# Patient Record
Sex: Male | Born: 1985 | Race: White | Hispanic: No | Marital: Single | Smoking: Never smoker
Health system: Southern US, Academic
[De-identification: ages and names within clinical notes are randomized; demographics above are authoritative.]

## PROBLEM LIST (undated history)

## (undated) DIAGNOSIS — K219 Gastro-esophageal reflux disease without esophagitis: Secondary | ICD-10-CM

## (undated) DIAGNOSIS — T7840XA Allergy, unspecified, initial encounter: Secondary | ICD-10-CM

## (undated) DIAGNOSIS — Q600 Renal agenesis, unilateral: Secondary | ICD-10-CM

## (undated) DIAGNOSIS — I517 Cardiomegaly: Secondary | ICD-10-CM

## (undated) DIAGNOSIS — M129 Arthropathy, unspecified: Secondary | ICD-10-CM

## (undated) HISTORY — DX: Allergy, unspecified, initial encounter: T78.40XA

## (undated) HISTORY — DX: Renal agenesis, unilateral: Q60.0

## (undated) HISTORY — PX: KNEE SURGERY: SHX244

## (undated) HISTORY — DX: Arthropathy, unspecified: M12.9

## (undated) HISTORY — DX: Gastro-esophageal reflux disease without esophagitis: K21.9

## (undated) HISTORY — PX: HX GALL BLADDER SURGERY/CHOLE: SHX55

## (undated) HISTORY — DX: Cardiomegaly: I51.7

## (undated) HISTORY — PX: HX HERNIA REPAIR: SHX51

---
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
## 2009-01-15 IMAGING — CR LT KNEE
1 series · 3 of 3 positions shown · non-contrast
Comparison: None available.

Suter, Alessandra

Sakura Lotz
Cesilia Knee – Three Views
HISTORY: Trauma two days ago due to fall. Previous history of surgery six years ago.

[Series 1: view not recorded · 0.17mm/px · 3 of 3 slices shown]
[im 1/3]
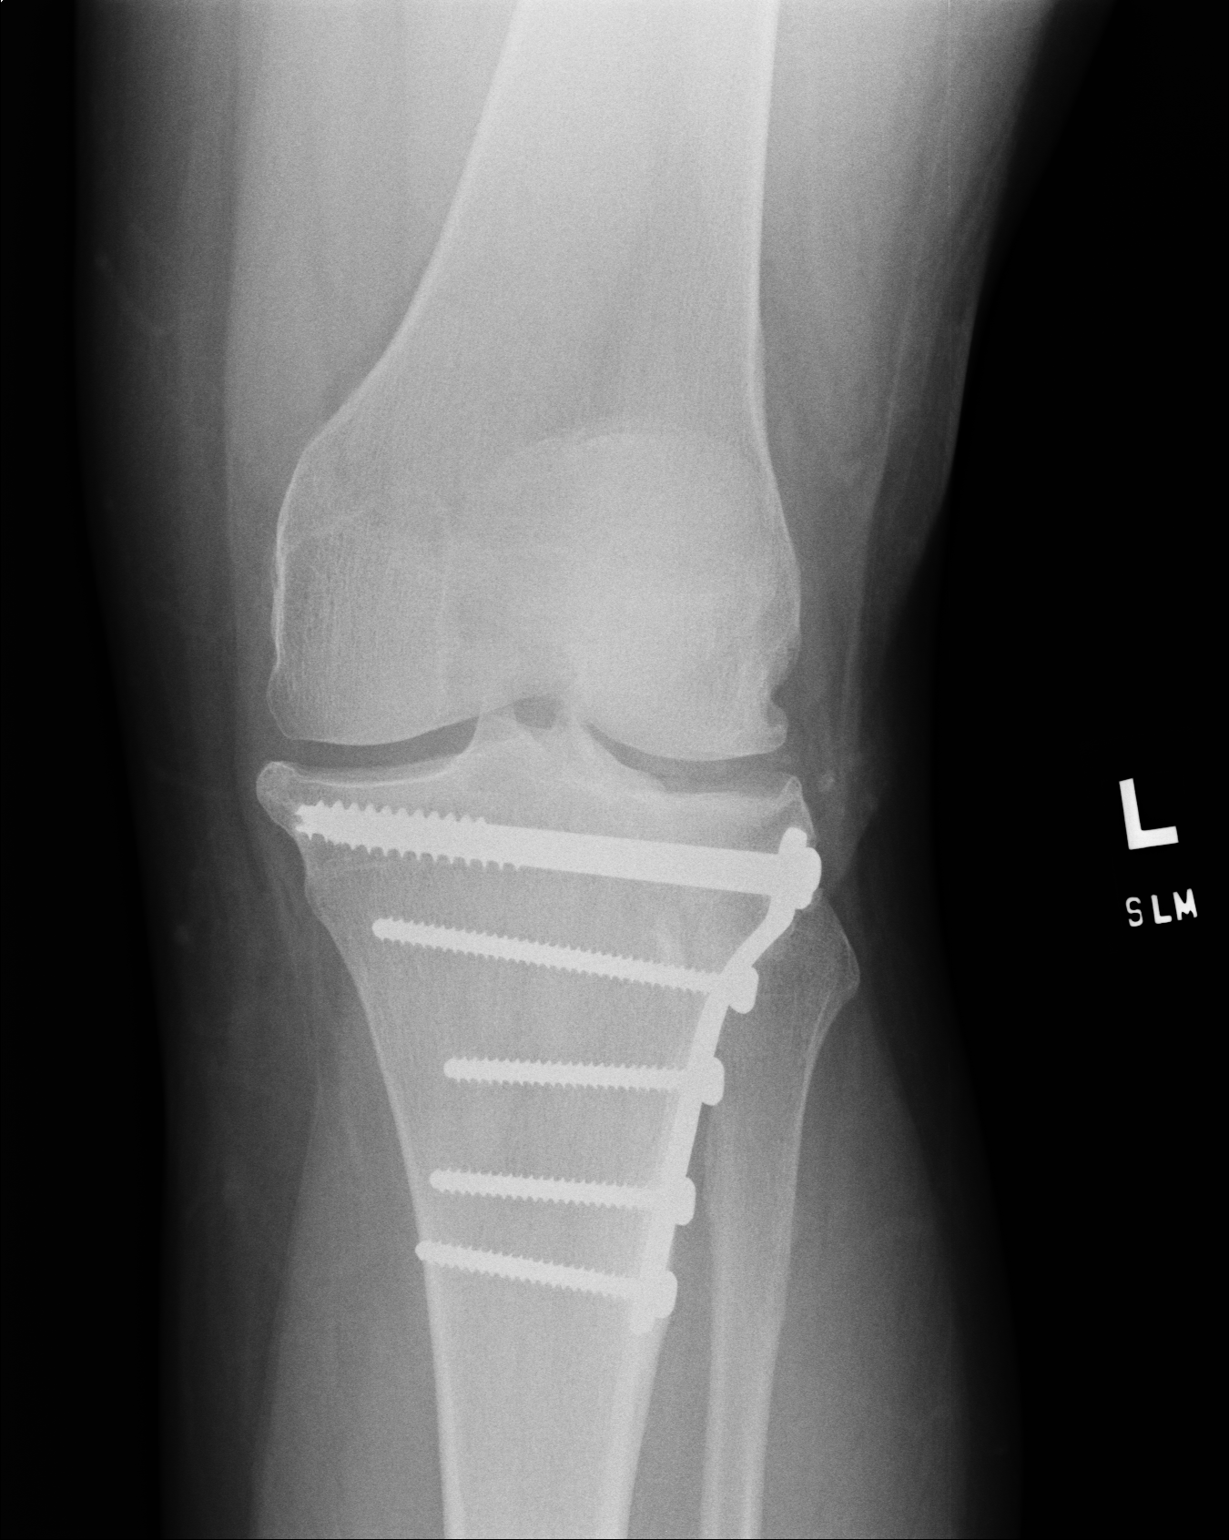
[im 2/3]
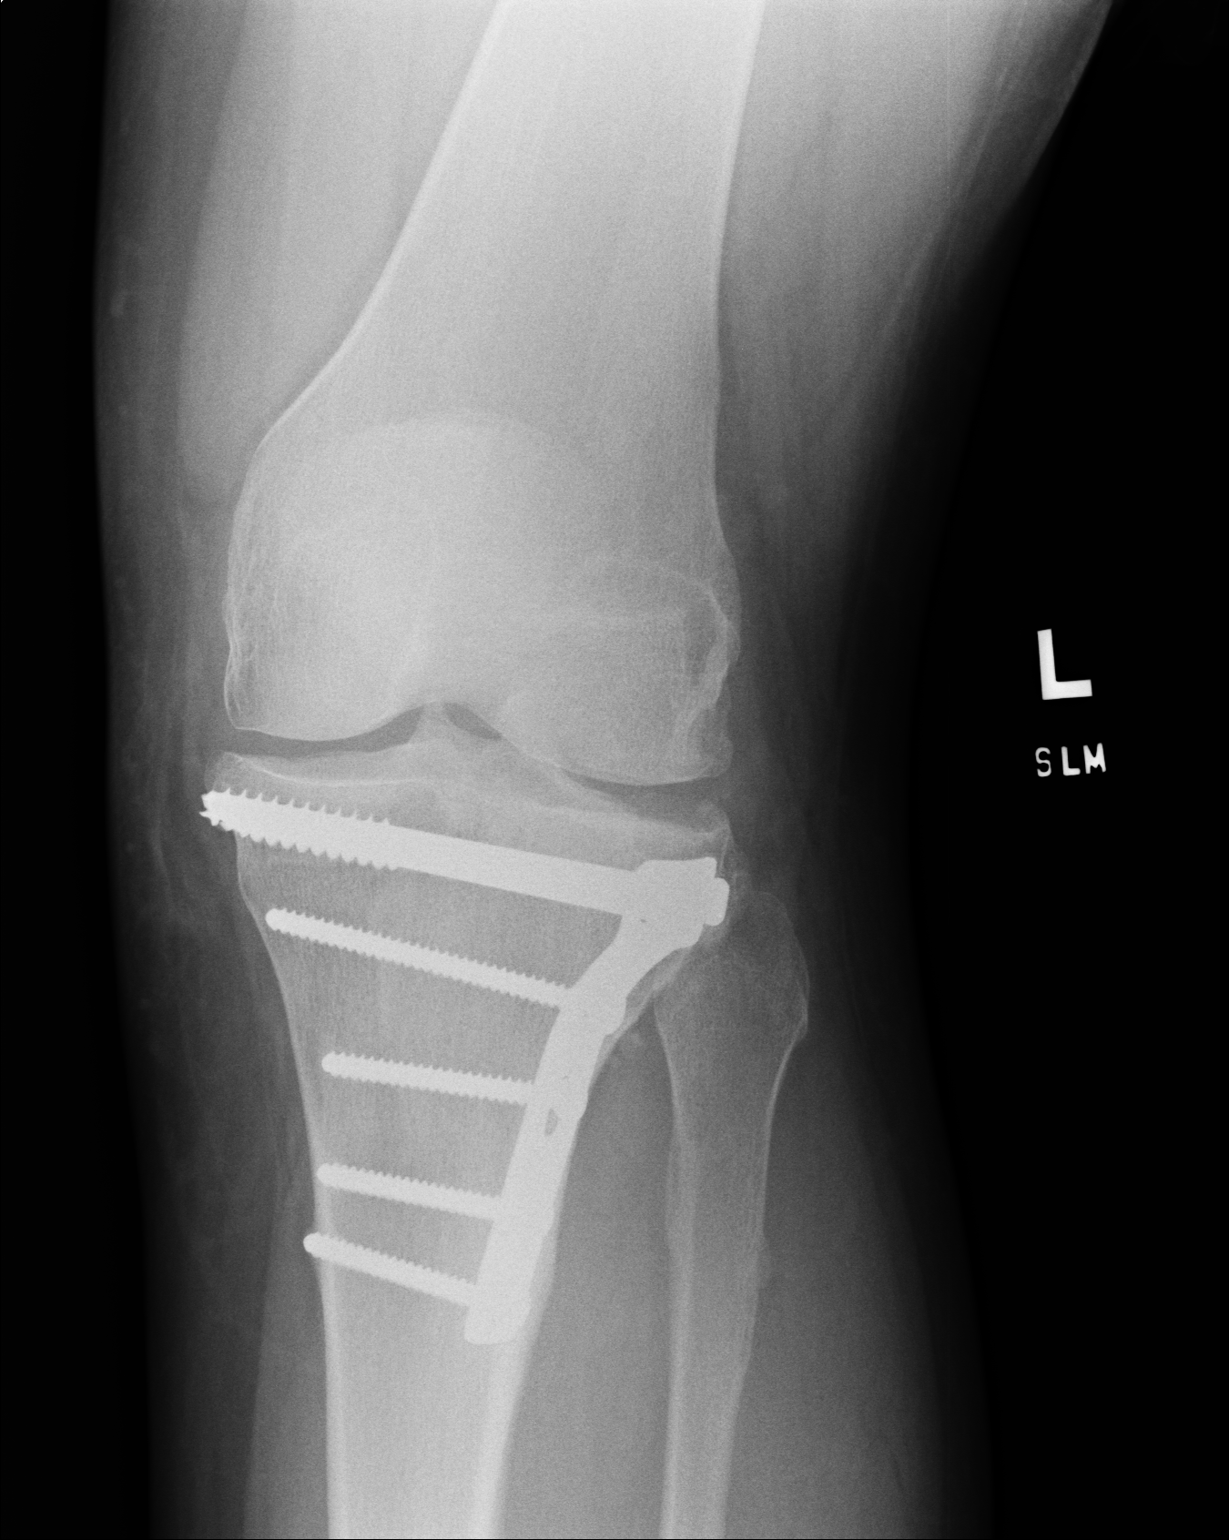
[im 3/3]
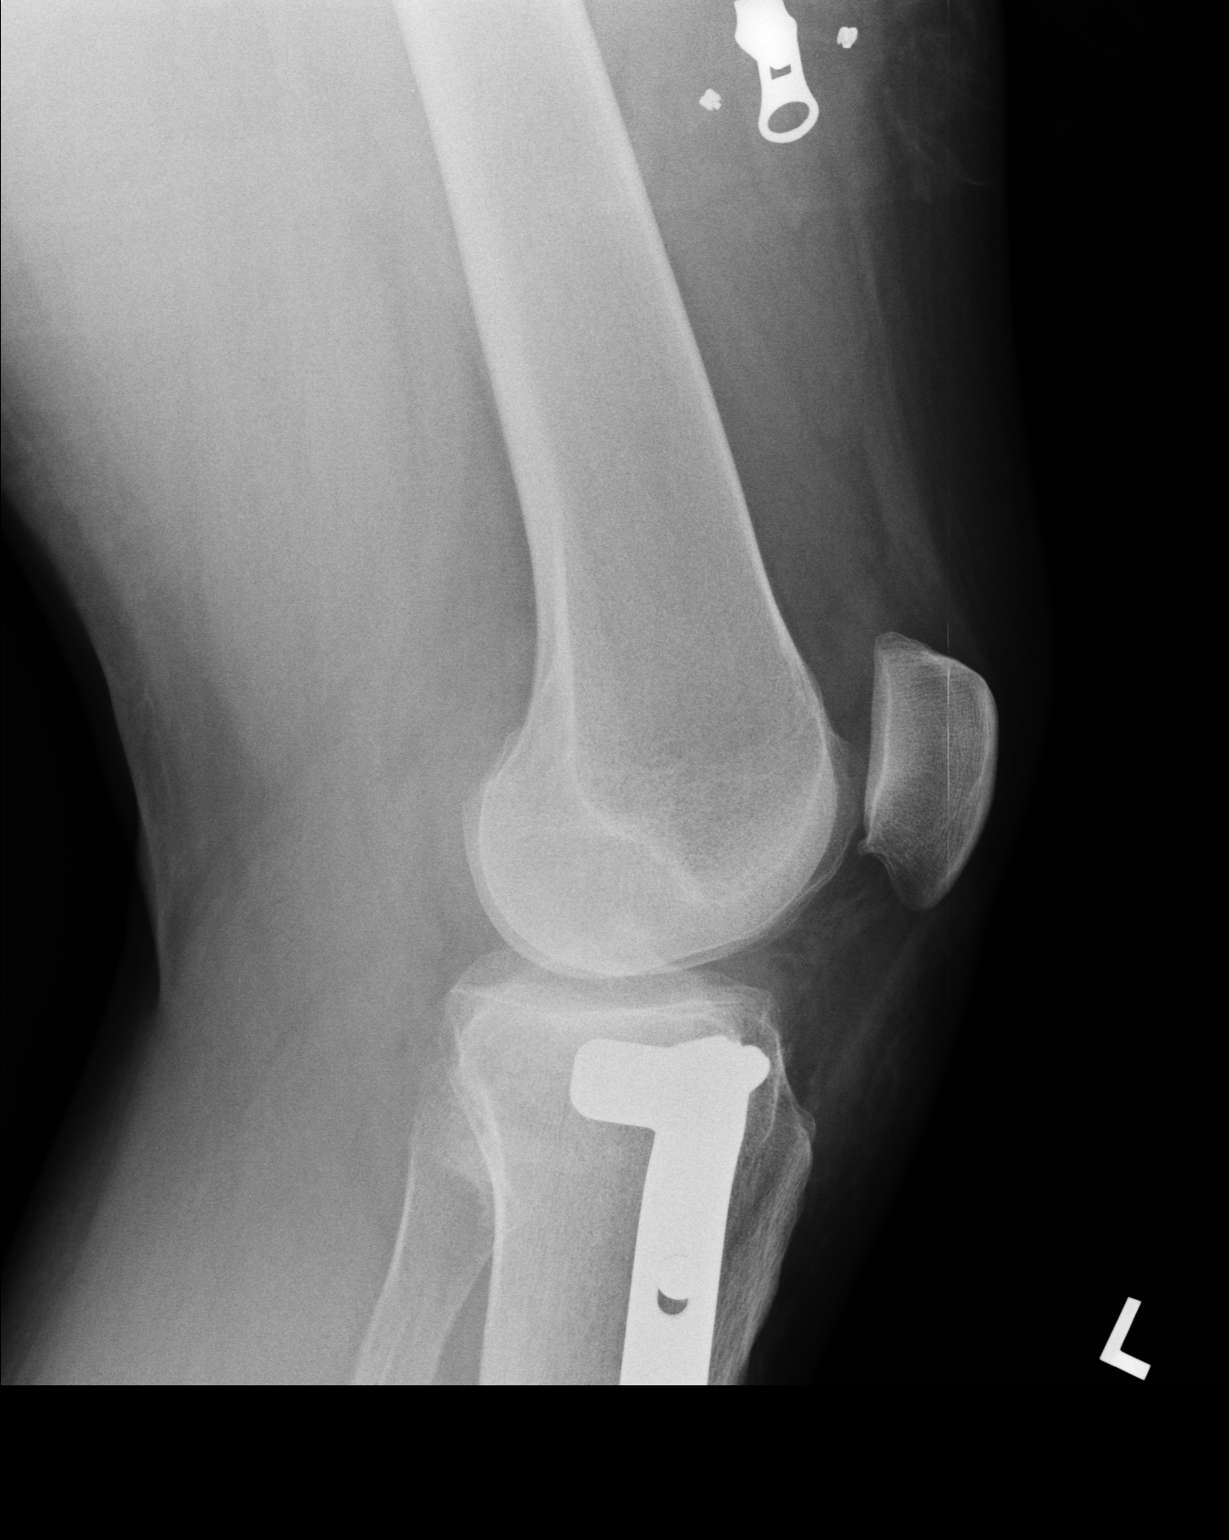

[3 of 3 positions shown; findings below may reference images not displayed]

FINDINGS: Old fracture of the proximal tibia with internal fixation is noted. Moderately significant degenerative changes of the knee joint are noted. Small effusion is noted in the knee joint. No definite evidence of acute fractures is seen.
IMPRESSION: Old fracture of proximal tibia with postoperative changes of the knee and significant degenerative arthritic changes of the knee are noted with a small effusion in the knee joint. No definite acute fractures are visible at the knee. If acute symptoms are severe, localized and persistent additional imaging studies may be considered.

_______________________________

## 2014-02-15 IMAGING — CR XRAY LUMBAR SPINE COMPLETE
1 series · 8 of 9 positions shown · non-contrast
Comparison: None.

Exam:   
Lumbar spine 4V
HISTORY: Back pain.

[Series 1: view not recorded · oblique · 0.17mm/px · 8 of 9 slices shown]
[im 1/9]
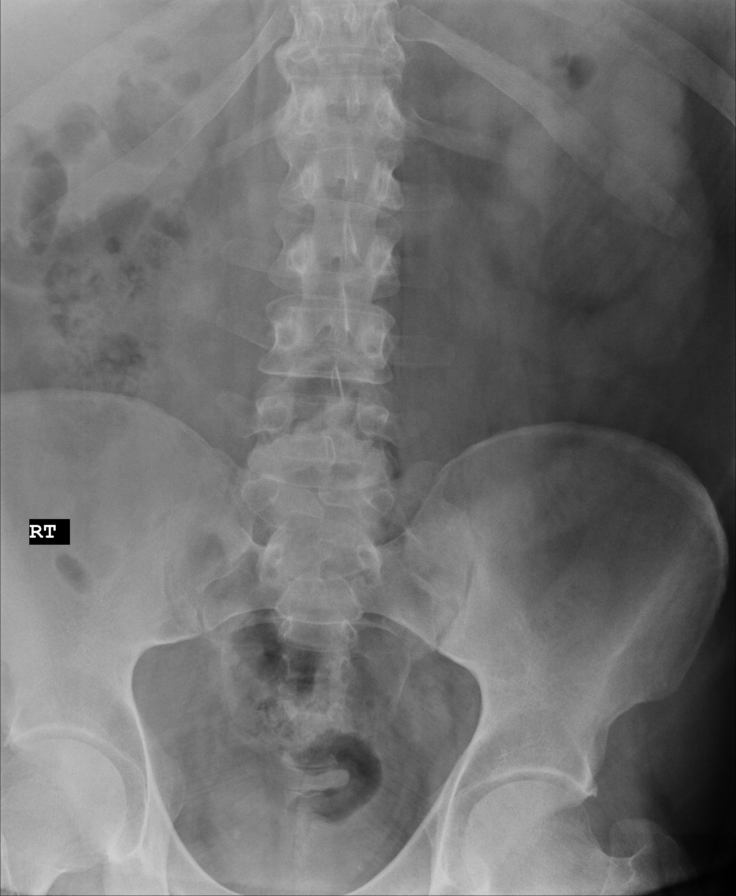
[im 2/9]
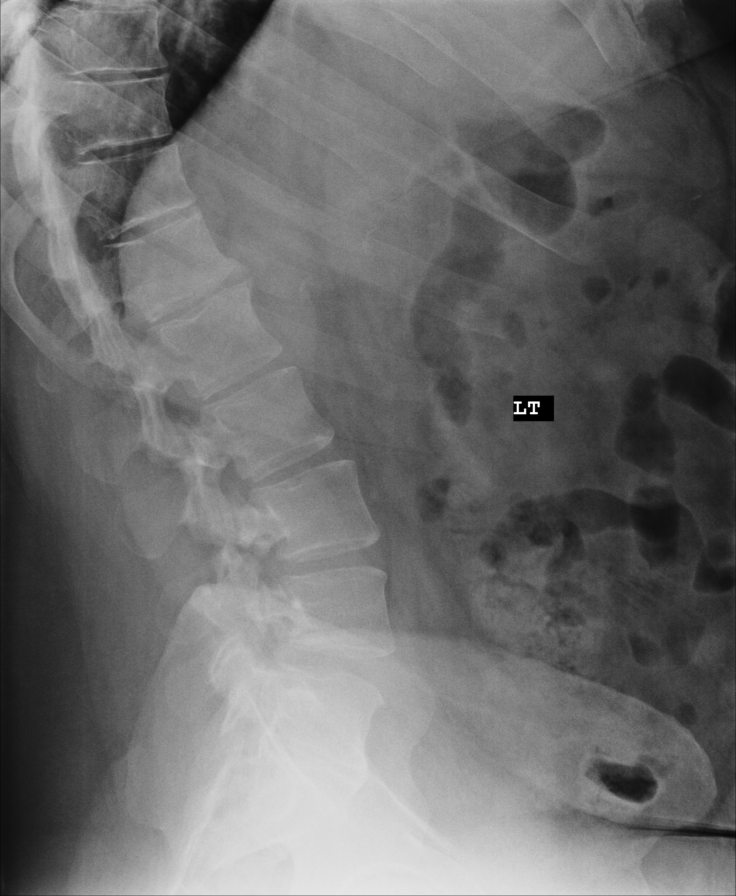
[im 3/9]
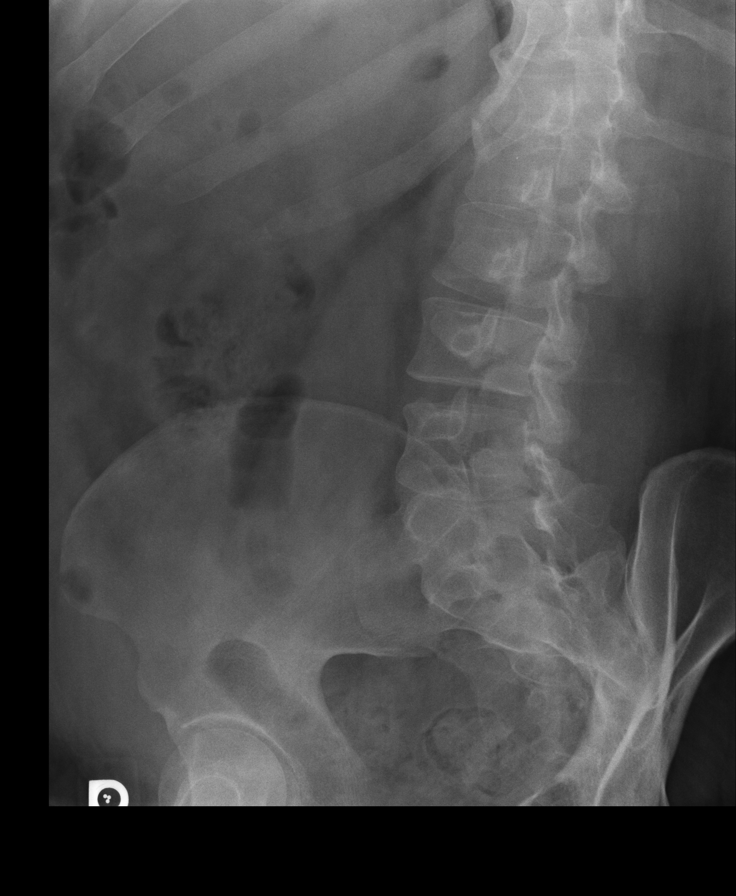
[im 4/9]
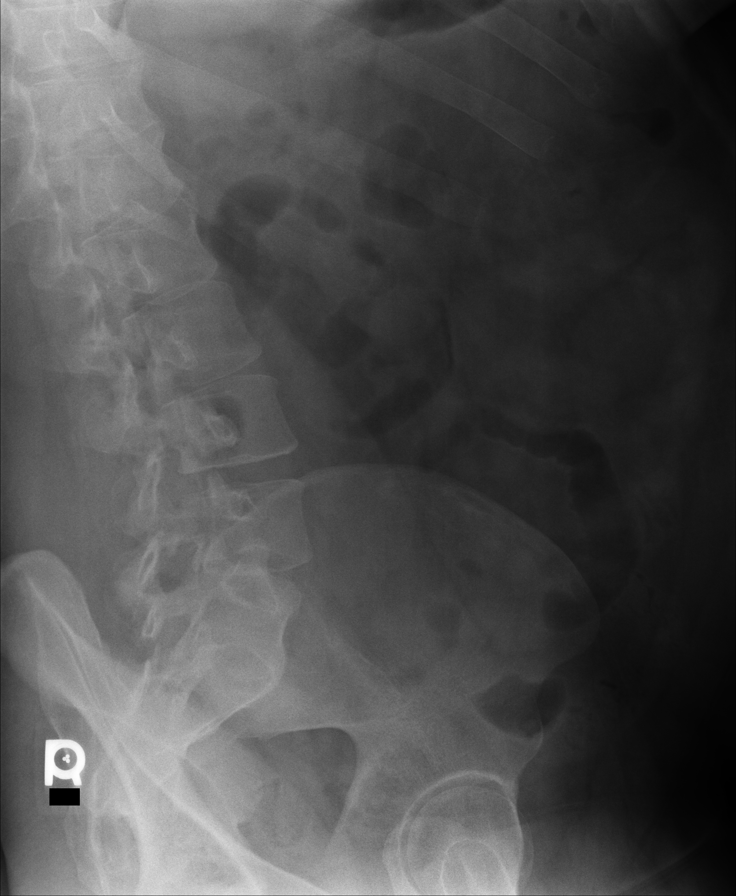
[im 5/9]
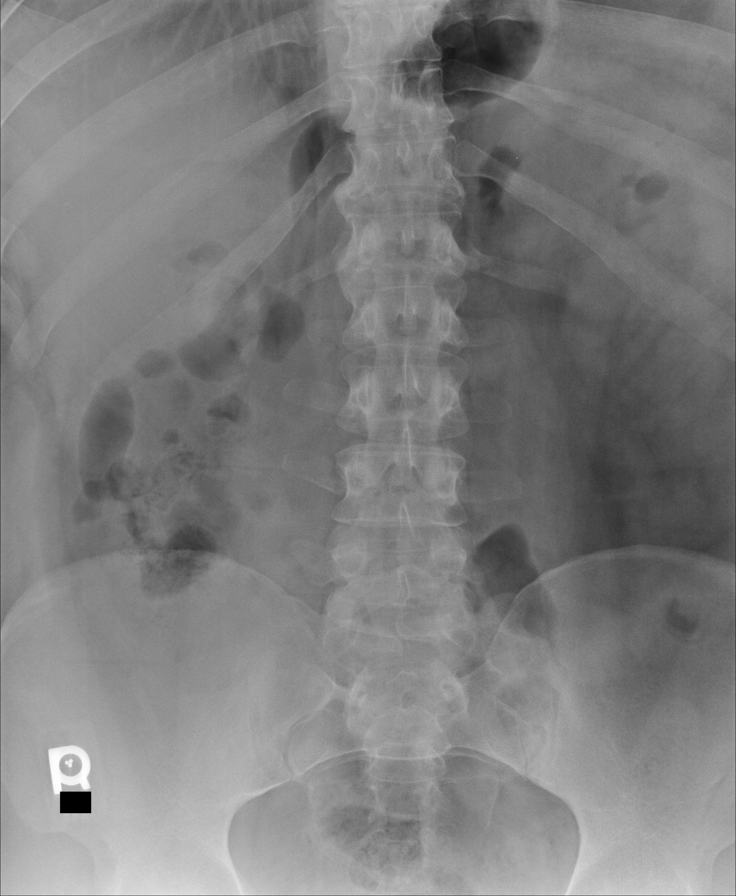
[im 6/9]
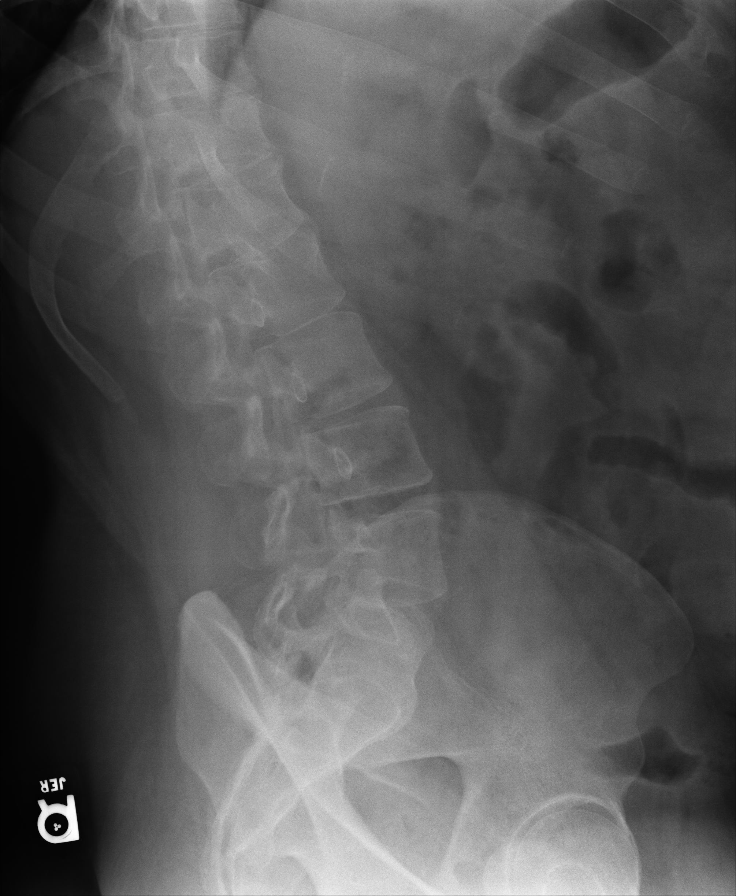
[im 7/9]
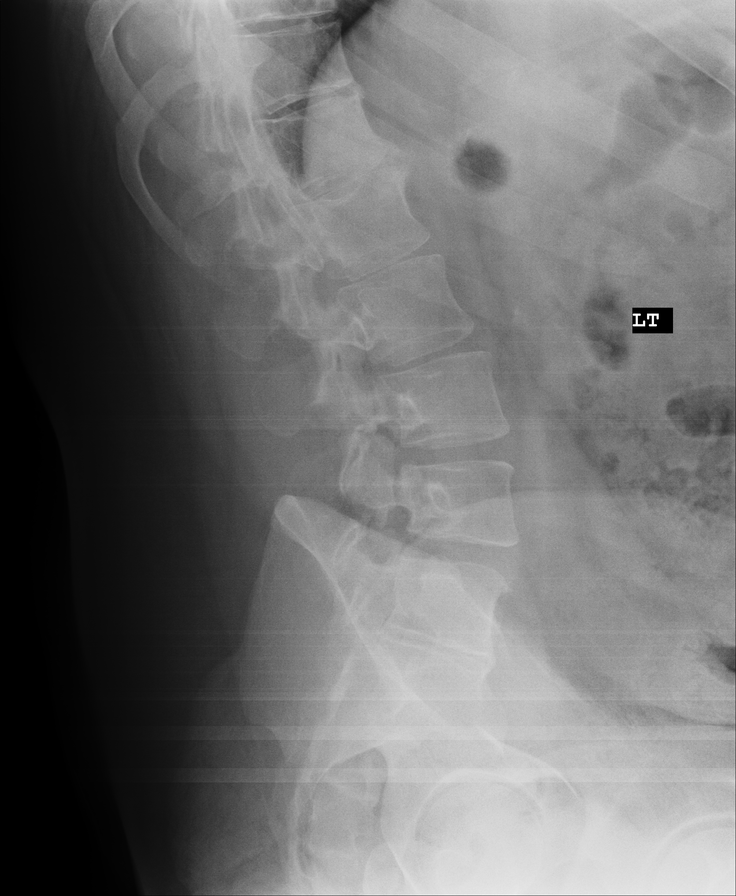
[im 8/9]
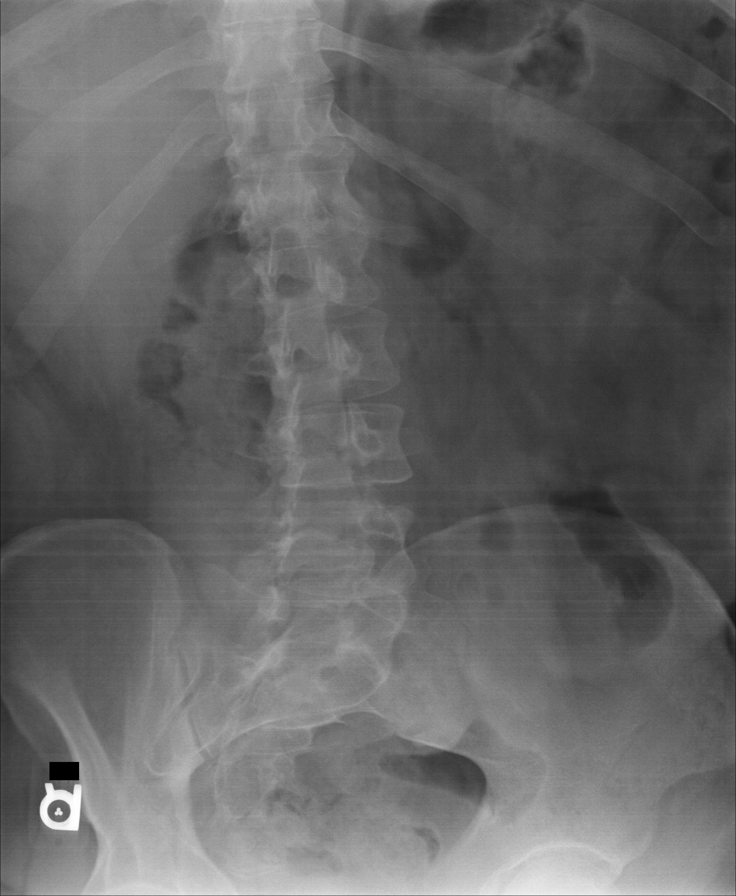

[8 of 9 positions shown; findings below may reference images not displayed]

FINDINGS: Mild diffuse multi level degenerative disc disease appreciated. There is pseudo articulation involving the transverse processes of the L5 vertebral body with the S1 segment. No compression fracture.
IMPRESSION: 1.
 Pseudoarthrosis of the L5 transverse process with the S1 segment which can be seen clinically with low back pain.

## 2017-07-16 ENCOUNTER — Ambulatory Visit (INDEPENDENT_AMBULATORY_CARE_PROVIDER_SITE_OTHER): Payer: Self-pay | Admitting: Internal Medicine

## 2020-02-23 ENCOUNTER — Other Ambulatory Visit (HOSPITAL_COMMUNITY): Payer: Self-pay

## 2020-02-23 LAB — EXTERNAL COVID-19 MOLECULAR RESULT: External 2019-n-CoV/SARS-CoV-2: POSITIVE — AB

## 2022-07-03 ENCOUNTER — Emergency Department (HOSPITAL_BASED_OUTPATIENT_CLINIC_OR_DEPARTMENT_OTHER): Payer: Medicare PPO

## 2022-07-03 ENCOUNTER — Emergency Department
Admission: EM | Admit: 2022-07-03 | Discharge: 2022-07-03 | Disposition: A | Discharge status: Home / Self Care | Payer: Medicare PPO | Attending: Emergency Medicine | Admitting: Emergency Medicine

## 2022-07-03 ENCOUNTER — Encounter (HOSPITAL_BASED_OUTPATIENT_CLINIC_OR_DEPARTMENT_OTHER): Payer: Self-pay

## 2022-07-03 ENCOUNTER — Other Ambulatory Visit: Payer: Self-pay

## 2022-07-03 DIAGNOSIS — K589 Irritable bowel syndrome without diarrhea: Secondary | ICD-10-CM

## 2022-07-03 DIAGNOSIS — R911 Solitary pulmonary nodule: Secondary | ICD-10-CM

## 2022-07-03 DIAGNOSIS — K529 Noninfective gastroenteritis and colitis, unspecified: Secondary | ICD-10-CM

## 2022-07-03 DIAGNOSIS — R1031 Right lower quadrant pain: Secondary | ICD-10-CM | POA: Insufficient documentation

## 2022-07-03 LAB — CBC WITH DIFF
BASOPHIL #: 0.05 10*3/uL (ref 0.00–0.30)
BASOPHIL %: 1 % (ref 0–3)
EOSINOPHIL #: 0.39 10*3/uL (ref 0.00–0.80)
EOSINOPHIL %: 5 % (ref 0–7)
HCT: 50.7 % (ref 42.0–51.0)
HGB: 17.1 g/dL (ref 13.5–18.0)
LYMPHOCYTE #: 2.05 10*3/uL (ref 1.10–5.00)
LYMPHOCYTE %: 28 % (ref 25–45)
MCH: 29.4 pg (ref 27.0–32.0)
MCHC: 33.8 g/dL (ref 32.0–36.0)
MCV: 87 fL (ref 78.0–99.0)
MONOCYTE #: 0.69 10*3/uL (ref 0.00–1.30)
MONOCYTE %: 9 % (ref 0–12)
MPV: 7.5 fL (ref 7.4–10.4)
NEUTROPHIL #: 4.09 10*3/uL (ref 1.80–8.40)
NEUTROPHIL %: 56 % (ref 40–76)
PLATELETS: 260 10*3/uL (ref 140–440)
RBC: 5.83 10*6/uL (ref 4.20–6.00)
RDW: 16 % — ABNORMAL HIGH (ref 11.6–14.8)
WBC: 7.3 10*3/uL (ref 4.0–10.5)

## 2022-07-03 LAB — COMPREHENSIVE METABOLIC PANEL, NON-FASTING
ALBUMIN/GLOBULIN RATIO: 1.1 (ref 0.8–1.4)
ALBUMIN: 3.8 g/dL (ref 3.4–5.0)
ALKALINE PHOSPHATASE: 69 U/L (ref 46–116)
ALT (SGPT): 18 U/L (ref <=78)
AST (SGOT): 24 U/L (ref 15–37)
BILIRUBIN TOTAL: 0.6 mg/dL (ref 0.2–1.0)
BUN/CREA RATIO: 12
BUN: 12 mg/dL (ref 7–18)
CALCIUM, CORRECTED: 9.1 mg/dL
CALCIUM: 8.9 mg/dL (ref 8.5–10.1)
CHLORIDE: 106 mmol/L (ref 98–107)
CO2 TOTAL: >45 mmol/L — ABNORMAL HIGH (ref 21–32)
CREATININE: 0.97 mg/dL (ref 0.70–1.30)
ESTIMATED GFR: 103 mL/min/{1.73_m2} (ref >59)
GLOBULIN: 3.6
GLUCOSE: 86 mg/dL (ref 74–106)
OSMOLALITY, CALCULATED: 279 mOsm/kg (ref 270–290)
POTASSIUM: 4.5 mmol/L (ref 3.5–5.1)
PROTEIN TOTAL: 7.4 g/dL (ref 6.4–8.2)
SODIUM: 140 mmol/L (ref 136–145)

## 2022-07-03 LAB — LIPASE: LIPASE: 78 U/L (ref 73–393)

## 2022-07-03 LAB — URINALYSIS, MACRO/MICRO
BILIRUBIN: NEGATIVE mg/dL
BLOOD: NEGATIVE mg/dL
GLUCOSE: NEGATIVE mg/dL
KETONES: NEGATIVE mg/dL
LEUKOCYTES: NEGATIVE WBCs/uL
NITRITE: NEGATIVE
PH: 6 (ref 4.6–8.0)
PROTEIN: NEGATIVE mg/dL
SPECIFIC GRAVITY: 1.025 (ref 1.003–1.035)
UROBILINOGEN: 0.2 mg/dL (ref 0.2–1.0)

## 2022-07-03 LAB — PT/INR
INR: 1.07 (ref 0.88–1.10)
PROTHROMBIN TIME: 12.4 seconds (ref 9.8–12.7)

## 2022-07-03 LAB — PTT (PARTIAL THROMBOPLASTIN TIME): APTT: 32.3 seconds — ABNORMAL HIGH (ref 22.4–31.7)

## 2022-07-03 MED ORDER — KETOROLAC 30 MG/ML (1 ML) INJECTION SOLUTION
30.0000 mg | INTRAMUSCULAR | Status: AC
Start: 2022-07-03 — End: 2022-07-03
  Administered 2022-07-03: 30 mg via INTRAVENOUS

## 2022-07-03 MED ORDER — SODIUM CHLORIDE 0.9 % IV BOLUS
1000.0000 mL | INJECTION | Status: AC
Start: 2022-07-03 — End: 2022-07-03
  Administered 2022-07-03: 1000 mL via INTRAVENOUS
  Administered 2022-07-03: 0 mL via INTRAVENOUS

## 2022-07-03 MED ORDER — ONDANSETRON HCL (PF) 4 MG/2 ML INJECTION SOLUTION
INTRAMUSCULAR | Status: AC
Start: 2022-07-03 — End: 2022-07-03
  Filled 2022-07-03: qty 2

## 2022-07-03 MED ORDER — KETOROLAC 30 MG/ML (1 ML) INJECTION SOLUTION
INTRAMUSCULAR | Status: AC
Start: 2022-07-03 — End: 2022-07-03
  Filled 2022-07-03: qty 1

## 2022-07-03 MED ORDER — ONDANSETRON HCL (PF) 4 MG/2 ML INJECTION SOLUTION
4.0000 mg | INTRAMUSCULAR | Status: AC
Start: 2022-07-03 — End: 2022-07-03
  Administered 2022-07-03: 4 mg via INTRAVENOUS

## 2022-07-03 MED ORDER — ONDANSETRON 4 MG DISINTEGRATING TABLET
4.0000 mg | ORAL_TABLET | Freq: Three times a day (TID) | ORAL | 0 refills | Status: AC | PRN
Start: 2022-07-03 — End: ?

## 2022-07-03 MED ORDER — KETOROLAC 10 MG TABLET
10.0000 mg | ORAL_TABLET | Freq: Four times a day (QID) | ORAL | 0 refills | Status: AC | PRN
Start: 2022-07-03 — End: ?

## 2022-07-03 MED ORDER — DICYCLOMINE 20 MG TABLET
20.0000 mg | ORAL_TABLET | Freq: Four times a day (QID) | ORAL | 0 refills | Status: AC
Start: 2022-07-03 — End: 2022-07-17

## 2022-07-03 NOTE — ED Triage Notes (Signed)
Pain lower abdomen for 4 days, and radiates into right flank. No dysuria or decrease flow or change in urine color. He still has appendix. Lightheaded and decrease appetite and feeling drained.

## 2022-07-03 NOTE — ED Nurses Note (Signed)
Patient c/o RLQ abdominal pain radiating to right flank. Rated pain 9/10. Patient stated pain started around Sunday becoming more severe today. Stated he has appendix. Stated nausea intermittent since Sunday. Denies vomiting or diarrhea. Guarding noted to RLQ and right flank.

## 2022-07-03 NOTE — ED Provider Notes (Signed)
[facility], [facility] Emergency Department  ED Primary Provider Note  History of Present Illness   Chief Complaint   Patient presents with    Abdominal Pain    Flank Pain     Right side       Scott Hahn is a 36 y.o. male who had concerns including Abdominal Pain and Flank Pain.  Arrival: The patient arrived by Car complaining right lower quadrant abdominal pain for the past 4 days.  Patient states it initially started as right lower quadrant on Sunday.  It has gradually worked its way upward to his right flank.  Patient continues to have right lower abdominal pain associated with decreased appetite.  Patient denies any fever but feels chills.  Patient denied any hematuria.  No dysuria or increased frequency.  Patient has not eaten at all today.  Patient denies any vomiting or diarrhea.  No black or bloody stools.  Patient states it is worse with movement of any type.    HPI  Review of Systems   Review of Systems   Constitutional:  Positive for activity change, appetite change and chills. Negative for fever.   HENT:  Negative for ear pain and sore throat.    Eyes:  Negative for pain and visual disturbance.   Respiratory:  Negative for cough and shortness of breath.    Cardiovascular:  Negative for chest pain and palpitations.   Gastrointestinal:  Positive for abdominal pain and nausea. Negative for vomiting.   Genitourinary:  Positive for flank pain. Negative for dysuria and hematuria.   Musculoskeletal:  Negative for arthralgias and back pain.   Skin:  Negative for color change and rash.   Neurological:  Negative for seizures and syncope.   All other systems reviewed and are negative.   Historical Data   History Reviewed This Encounter:     Physical Exam   ED Triage Vitals [07/03/22 1545]   BP (Non-Invasive) (!) 138/94   Heart Rate 91   Respiratory Rate 20   Temperature 36.8 C (98.2 F)   SpO2 96 %   Weight 122 kg (270 lb)   Height 1.88 m (6' 2" )     Physical Exam  Vitals and  nursing note reviewed.   Constitutional:       General: He is not in acute distress.     Appearance: He is well-developed. He is obese.   HENT:      Head: Normocephalic and atraumatic.      Right Ear: External ear normal.      Left Ear: External ear normal.      Nose: Nose normal.      Mouth/Throat:      Mouth: Mucous membranes are moist.   Eyes:      Extraocular Movements: Extraocular movements intact.      Conjunctiva/sclera: Conjunctivae normal.      Pupils: Pupils are equal, round, and reactive to light.   Cardiovascular:      Rate and Rhythm: Normal rate and regular rhythm.      Pulses: Normal pulses.      Heart sounds: Normal heart sounds. No murmur heard.  Pulmonary:      Effort: Pulmonary effort is normal. No respiratory distress.      Breath sounds: Normal breath sounds.   Abdominal:      General: Bowel sounds are normal.      Palpations: Abdomen is soft.      Tenderness: There is abdominal tenderness. There is right  CVA tenderness.      Comments: Positive tenderness right lower quadrant at McBurney's point.  Positive right CVA tenderness.  Patient has guarding but no rebound.   Musculoskeletal:         General: No swelling. Normal range of motion.      Cervical back: Normal range of motion and neck supple.   Skin:     General: Skin is warm and dry.      Capillary Refill: Capillary refill takes less than 2 seconds.   Neurological:      General: No focal deficit present.      Mental Status: He is alert and oriented to person, place, and time.   Psychiatric:         Mood and Affect: Mood normal.         Behavior: Behavior normal.         Thought Content: Thought content normal.     Patient Data     Labs Ordered/Reviewed   COMPREHENSIVE METABOLIC PANEL, NON-FASTING - Abnormal; Notable for the following components:       Result Value    CO2 TOTAL >45 (*)     All other components within normal limits    Narrative:     Estimated Glomerular Filtration Rate (eGFR) is calculated using the CKD-EPI (2021) equation,  intended for patients 21 years of age and older. If gender is not documented or "unknown", there will be no eGFR calculation.   PTT (PARTIAL THROMBOPLASTIN TIME) - Abnormal; Notable for the following components:    APTT 32.3 (*)     All other components within normal limits   CBC WITH DIFF - Abnormal; Notable for the following components:    RDW 16.0 (*)     All other components within normal limits   LIPASE - Normal   PT/INR - Normal   URINALYSIS, MACRO/MICRO - Normal   CBC/DIFF    Narrative:     The following orders were created for panel order CBC/DIFF.  Procedure                               Abnormality         Status                     ---------                               -----------         ------                     CBC WITH DIFF[[id number]]                Abnormal            Final result                 Please view results for these tests on the individual orders.   URINALYSIS WITH REFLEX MICROSCOPIC AND CULTURE IF POSITIVE    Narrative:     The following orders were created for panel order URINALYSIS WITH REFLEX MICROSCOPIC AND CULTURE IF POSITIVE.  Procedure                               Abnormality         Status                     ---------                               -----------         ------  URINALYSIS, MACRO/MICRO[[id number]]      Normal              Final result                 Please view results for these tests on the individual orders.     CT ABDOMEN PELVIS WO IV CONTRAST   Final Result by Edi, Radresults In (08/17 1723)   NO ACUTE FINDINGS AT THE ABDOMEN OR PELVIS ON NONCONTRAST CT.    STABLE RIGHT LOWER LUNG NODULE WITH PARTIAL CALCIFICATION. MAY BE A GRANULOMA.         One or more dose reduction techniques were used (e.g., Automated exposure control, adjustment of the mA and/or kV according to patient size, use of iterative reconstruction technique).         Radiologist location ID: [place] [facility]        Medical Decision Making  Patient  is 36 year old white male complaining right lower quadrant since Sunday.  Pain radiates to the right CVA area.  Patient has lost all appetite today.  Patient states he has been having chills but no fever.  Patient has been nauseous but denies any vomiting or diarrhea.  No hematuria.  No dysuria or increased frequency.  Patient denies any black or bloody stools.  Patient states it is worse with any type of movement.  Patient will have an IV placed with hydration.  Patient will also have labs as well as a CT of the abdomen and pelvis.  Depending on what the CT shows will depend on what further treatment patient will need.  Differential includes appendicitis.  Diverticulitis, kidney stone, UTI, colitis, dehydration.    Amount and/or Complexity of Data Reviewed  Labs: ordered.  Radiology: ordered.    Risk  Prescription drug management.             Medications Administered in the ED   NS bolus infusion 1,000 mL (1,000 mL Intravenous New Bag/New Syringe 07/03/22 1627)   ketorolac (TORADOL) 30 mg/mL injection (30 mg Intravenous Given 07/03/22 1627)   ondansetron (ZOFRAN) 2 mg/mL injection (4 mg Intravenous Given 07/03/22 1627)     Clinical Impression   Right lower quadrant abdominal pain (Primary)   Irritable bowel syndrome, unspecified type   Colitis       Disposition: Discharged               Clinical Impression   Right lower quadrant abdominal pain (Primary)   Irritable bowel syndrome, unspecified type   Colitis       Current Discharge Medication List        START taking these medications    Details   dicyclomine (BENTYL) 20 mg Oral Tablet Take 1 Tablet (20 mg total) by mouth Four times a day for 14 days  Qty: 56 Tablet, Refills: 0      ketorolac tromethamine (TORADOL) 10 mg Oral Tablet Take 1 Tablet (10 mg total) by mouth Every 6 hours as needed for Pain  Qty: 20 Tablet, Refills: 0      ondansetron (ZOFRAN ODT) 4 mg Oral Tablet, Rapid Dissolve Take 1 Tablet (4 mg total) by mouth Every 8 hours as needed for  Nausea/Vomiting  Qty: 12 Tablet, Refills: 0

## 2022-07-03 NOTE — ED Nurses Note (Signed)
Patient reports feeling some better at this time. Patient given written and verbal d/c instructions. All questions/concerns addressed. Informed to return with any concerns. Given x3 prescription scripts. Patient verbalizes understanding of discharge instructions. Patient leaving ambulatory at this time.

## 2022-10-18 ENCOUNTER — Emergency Department
Admission: EM | Admit: 2022-10-18 | Discharge: 2022-10-18 | Disposition: A | Discharge status: Home / Self Care | Payer: Medicare PPO | Attending: Emergency Medicine | Admitting: Emergency Medicine

## 2022-10-18 ENCOUNTER — Encounter (HOSPITAL_BASED_OUTPATIENT_CLINIC_OR_DEPARTMENT_OTHER): Payer: Self-pay

## 2022-10-18 ENCOUNTER — Emergency Department (HOSPITAL_BASED_OUTPATIENT_CLINIC_OR_DEPARTMENT_OTHER): Payer: Medicare PPO

## 2022-10-18 ENCOUNTER — Other Ambulatory Visit: Payer: Self-pay

## 2022-10-18 DIAGNOSIS — J40 Bronchitis, not specified as acute or chronic: Secondary | ICD-10-CM | POA: Insufficient documentation

## 2022-10-18 DIAGNOSIS — K219 Gastro-esophageal reflux disease without esophagitis: Secondary | ICD-10-CM | POA: Insufficient documentation

## 2022-10-18 DIAGNOSIS — Z1152 Encounter for screening for COVID-19: Secondary | ICD-10-CM | POA: Insufficient documentation

## 2022-10-18 DIAGNOSIS — J069 Acute upper respiratory infection, unspecified: Secondary | ICD-10-CM

## 2022-10-18 LAB — COVID-19, FLU A/B, RSV RAPID BY PCR
INFLUENZA VIRUS TYPE A: NOT DETECTED
INFLUENZA VIRUS TYPE B: NOT DETECTED
RESPIRATORY SYNCTIAL VIRUS (RSV): NOT DETECTED
SARS-CoV-2: NOT DETECTED

## 2022-10-18 MED ORDER — AZITHROMYCIN 250 MG TABLET
ORAL_TABLET | ORAL | Status: AC
Start: 2022-10-18 — End: 2022-10-18
  Filled 2022-10-18: qty 2

## 2022-10-18 MED ORDER — BENZONATATE 100 MG CAPSULE
100.0000 mg | ORAL_CAPSULE | Freq: Three times a day (TID) | ORAL | 0 refills | Status: AC | PRN
Start: 2022-10-18 — End: ?

## 2022-10-18 MED ORDER — AZITHROMYCIN 250 MG TABLET
500.0000 mg | ORAL_TABLET | ORAL | Status: AC
Start: 2022-10-18 — End: 2022-10-18
  Administered 2022-10-18: 500 mg via ORAL

## 2022-10-18 MED ORDER — AZITHROMYCIN 500 MG TABLET
500.0000 mg | ORAL_TABLET | ORAL | 0 refills | Status: AC
Start: 2022-10-18 — End: ?

## 2022-10-18 NOTE — ED Nurses Note (Signed)
Test results reviewed with patient.

## 2022-10-18 NOTE — ED Triage Notes (Signed)
Patient states started Monday night cough, sore throat, runny nose, and body aches.

## 2022-10-18 NOTE — ED Provider Notes (Signed)
[facility], [facility] Emergency Department  ED Primary Provider Note  History of Present Illness   Chief Complaint   Patient presents with    Flu Like Symptoms     Scott Hahn is a 36 y.o. male who had concerns including Flu Like Symptoms.  Arrival: The patient arrived by Car complaining of coughing yellowish sputum for the past 5 days.  Patient has been having chills at night with aches and pains all over.  Patient states he is nasal congestion with postnasal drip as well.  Patient denies any sore throat or severe headache.  No chest pain or shortness of breath.  Patient states on Monday he had a sore throat but it went away.  He continued with the runny nose cough as well as body aches.  Patient does have a history of GERD.  Patient denies any smoking history.  No alcohol or drug use.    HPI  Review of Systems   Review of Systems   Constitutional:  Positive for activity change, appetite change, chills and fever.   HENT:  Positive for congestion, postnasal drip and rhinorrhea. Negative for ear pain and sore throat.    Eyes:  Negative for pain and visual disturbance.   Respiratory:  Positive for cough. Negative for shortness of breath.    Cardiovascular:  Negative for chest pain and palpitations.   Gastrointestinal:  Negative for abdominal pain and vomiting.   Genitourinary:  Negative for dysuria and hematuria.   Musculoskeletal:  Positive for arthralgias and myalgias. Negative for back pain.   Skin:  Negative for color change and rash.   Neurological:  Negative for seizures and syncope.   All other systems reviewed and are negative.     Historical Data   History Reviewed This Encounter:     Physical Exam   ED Triage Vitals [10/18/22 1200]   BP (Non-Invasive) (!) 153/101   Heart Rate 97   Respiratory Rate 18   Temperature 36.4 C (97.6 F)   SpO2 94 %   Weight 125 kg (275 lb)   Height 1.88 m (6\' 2" )     Physical Exam  Vitals and nursing note reviewed.   Constitutional:        General: He is not in acute distress.     Appearance: He is well-developed. He is obese.   HENT:      Head: Normocephalic and atraumatic.      Right Ear: External ear normal.      Left Ear: External ear normal.      Nose: Congestion and rhinorrhea present.      Mouth/Throat:      Mouth: Mucous membranes are dry.   Eyes:      Extraocular Movements: Extraocular movements intact.      Conjunctiva/sclera: Conjunctivae normal.      Pupils: Pupils are equal, round, and reactive to light.   Cardiovascular:      Rate and Rhythm: Normal rate and regular rhythm.      Pulses: Normal pulses.      Heart sounds: Normal heart sounds. No murmur heard.  Pulmonary:      Effort: Pulmonary effort is normal. No respiratory distress.      Breath sounds: Normal breath sounds.   Abdominal:      General: Bowel sounds are normal.      Palpations: Abdomen is soft.      Tenderness: There is no abdominal tenderness.   Musculoskeletal:  General: No swelling. Normal range of motion.      Cervical back: Normal range of motion and neck supple.   Skin:     General: Skin is warm and dry.      Capillary Refill: Capillary refill takes less than 2 seconds.   Neurological:      General: No focal deficit present.      Mental Status: He is alert and oriented to person, place, and time.   Psychiatric:         Mood and Affect: Mood normal.         Behavior: Behavior normal.         Thought Content: Thought content normal.       Patient Data     Labs Ordered/Reviewed   COVID-19, FLU A/B, RSV RAPID BY PCR - Normal    Narrative:     Results are for the simultaneous qualitative identification of SARS-CoV-2 (formerly 2019-nCoV), Influenza A, Influenza B, and RSV RNA. These etiologic agents are generally detectable in nasopharyngeal and nasal swabs during the ACUTE PHASE of infection. Hence, this test is intended to be performed on respiratory specimens collected from individuals with signs and symptoms of upper respiratory tract infection who meet Centers  for Disease Control and Prevention (CDC) clinical and/or epidemiological criteria for Coronavirus Disease 2019 (COVID-19) testing. CDC COVID-19 criteria for testing on human specimens is available at [facility] webpage information for Healthcare Professionals: Coronavirus Disease 2019 (COVID-19) ([employer]).     False-negative results may occur if the virus has genomic mutations, insertions, deletions, or rearrangements or if performed very early in the course of illness. Otherwise, negative results indicate virus specific RNA targets are not detected, however negative results do not preclude SARS-CoV-2 infection/COVID-19, Influenza, or Respiratory syncytial virus infection. Results should not be used as the sole basis for patient management decisions. Negative results must be combined with clinical observations, patient history, and epidemiological information. If upper respiratory tract infection is still suspected based on exposure history together with other clinical findings, re-testing should be considered.    Disclaimer:   This assay has been authorized by FDA under an Emergency Use Authorization for use in laboratories certified under the Clinical Laboratory Improvement Amendments of 1988 (CLIA), 42 U.S.C. [phone], to perform high complexity tests. The impacts of vaccines, antiviral therapeutics, antibiotics, chemotherapeutic or immunosuppressant drugs have not been evaluated.     Test methodology:   Cepheid Xpert Xpress SARS-CoV-2/Flu/RSV Assay real-time polymerase chain reaction (RT-PCR) test on the GeneXpert Dx and Xpert Xpress systems.     XR CHEST PA AND LATERAL   Final Result by Edi, Radresults In (12/02 1239)   NO ACUTE FINDINGS.         Radiologist location ID: [place] Making        Medical Decision Making  Patient is 36 year old white male complaining having nasal congestion with a sore throat and cough on Monday.  He states he  brings up yellowish sputum.  His sore throat got better on Tuesday but is still complaining of having chills at night as well as aches and pains all over.  Patient denies any chest pain or shortness breath.  Patient has never taken his temp.  Patient denies any difficulty swallowing or drooling.  No voice change.  Patient will have for Plex done as well as chest x-ray.  Patient will then be treated according to tests.  Patient will be discharged home to  be followed up in 3-4 days by PMD.    Amount and/or Complexity of Data Reviewed  Radiology: ordered.    Risk  Prescription drug management.             Medications Administered in the ED   azithromycin (ZITHROMAX) tablet (has no administration in time range)     Clinical Impression   Upper respiratory tract infection, unspecified type (Primary)   Bronchitis       Disposition: Discharged               Clinical Impression   Upper respiratory tract infection, unspecified type (Primary)   Bronchitis       Current Discharge Medication List        START taking these medications    Details   azithromycin (ZITHROMAX) 500 mg Oral Tablet Take 1 Tablet (500 mg total) by mouth Every 24 hours  Qty: 6 Tablet, Refills: 0      benzonatate (TESSALON) 100 mg Oral Capsule Take 1 Capsule (100 mg total) by mouth Every 8 hours as needed for Cough  Qty: 12 Capsule, Refills: 0

## 2022-10-18 NOTE — ED Nurses Note (Signed)
Patient discharged home all instructions gone over with patient including medications with opportunity to ask questions. Patient verbalized understanding and ambulated off unit independently.

## 2023-02-04 IMAGING — MR MRI LUMBAR SPINE WITHOUT CONTRAST
5 of 6 series · 33 of 48 positions shown · non-contrast
Comparison: None available.

﻿EXAM:  [id number]   MRI LUMBAR SPINE WITHOUT CONTRAST
INDICATION: Low back pain, ow back pain, radiculopathy.  Right leg pain/ numbness
TECHNIQUE: Noncontrast multiplanar, multisequence MRI was performed.

[Series 5: T2 · sagittal · 4.0mm · 0.94mm/px · 6 of 13 slices shown (1 of 3)]
[im 1/13]
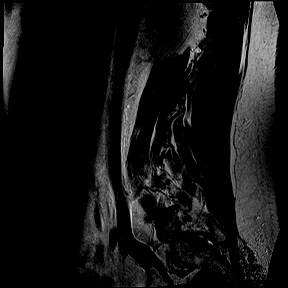
[im 3/13]
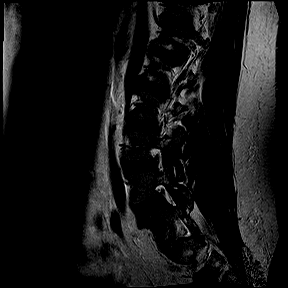
[im 5/13]
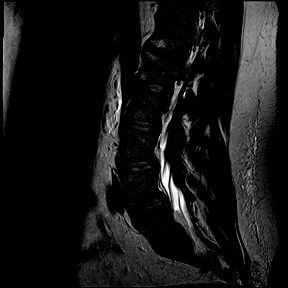
[im 8/13]
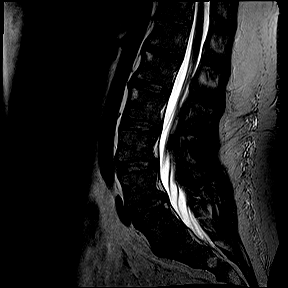
[im 10/13]
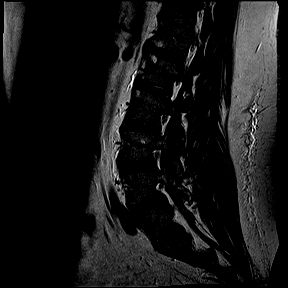
[im 13/13]
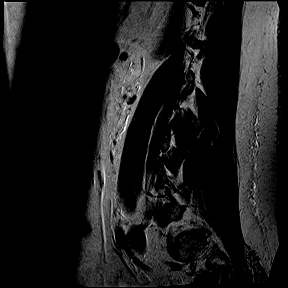

[Series 6: T1 · sagittal · 4.0mm · 0.94mm/px · 6 of 13 slices shown (1 of 2)]
[im 1/13]
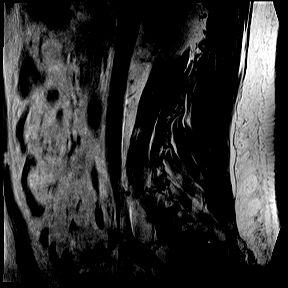
[im 3/13]
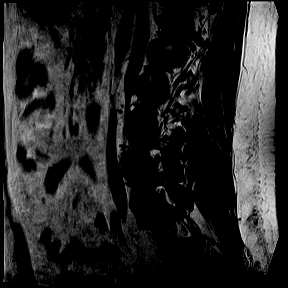
[im 5/13]
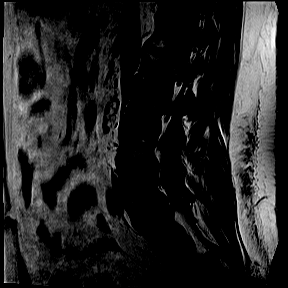
[im 8/13]
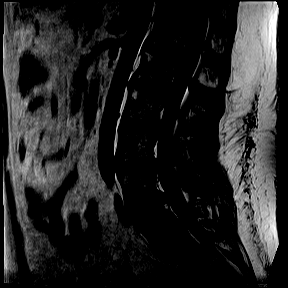
[im 10/13]
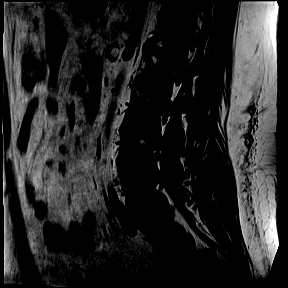
[im 13/13]
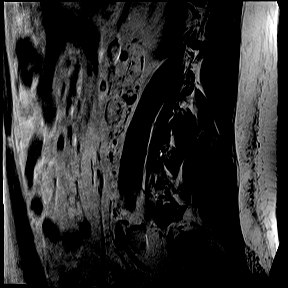

[Series 8: T2 · coronal · 5.0mm · 0.82mm/px · 8 of 18 slices shown (2 of 3)]
[im 1/18]
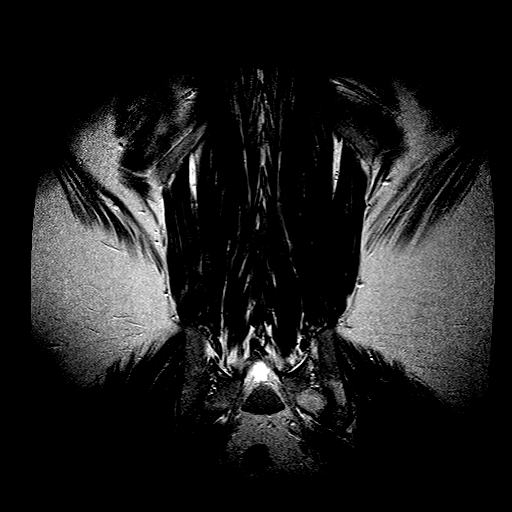
[im 3/18]
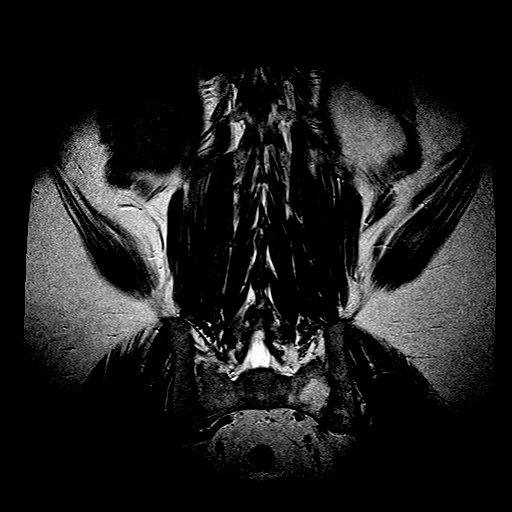
[im 5/18]
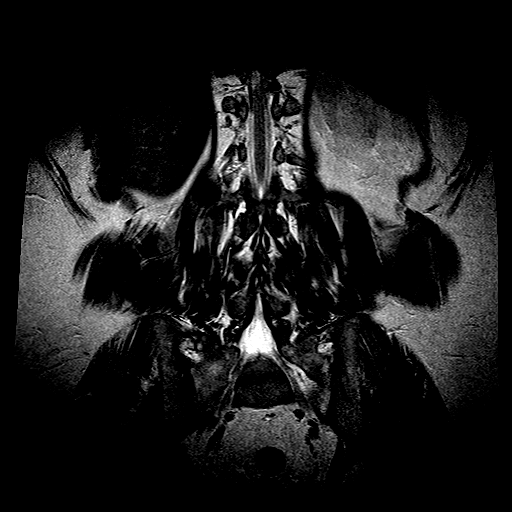
[im 8/18]
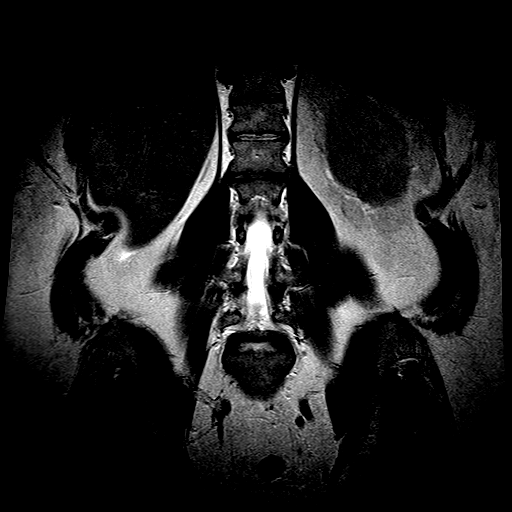
[im 10/18]
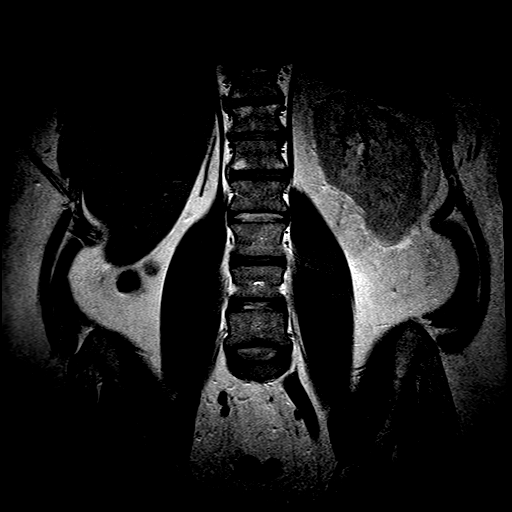
[im 13/18]
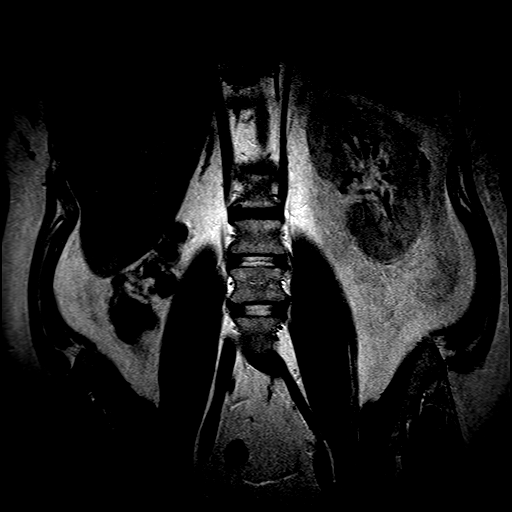
[im 15/18]
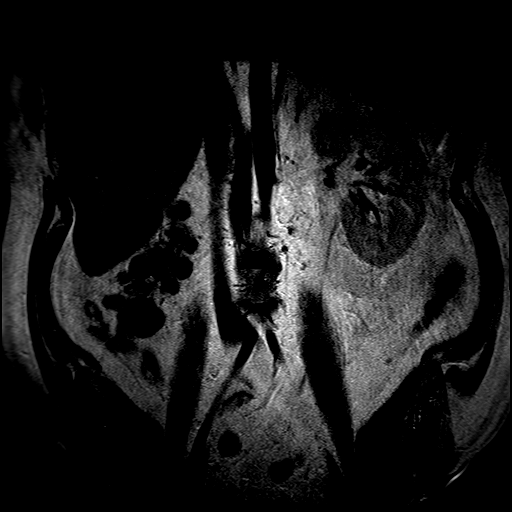
[im 18/18]
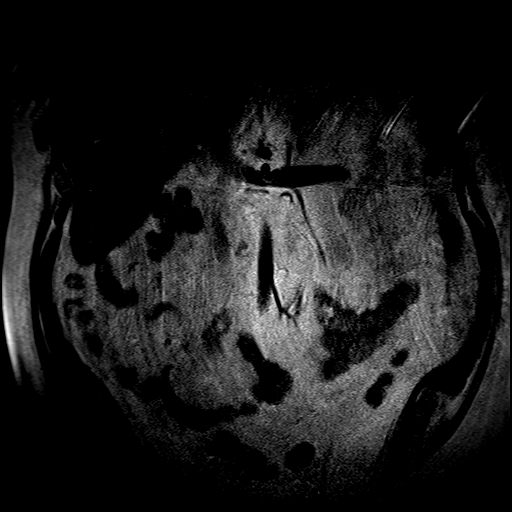

[Series 9: T2 · axial · 4.0mm · 0.52mm/px · z∈[-115,+95]mm · 11 of 23 slices shown (3 of 3)]
[im 1/23]
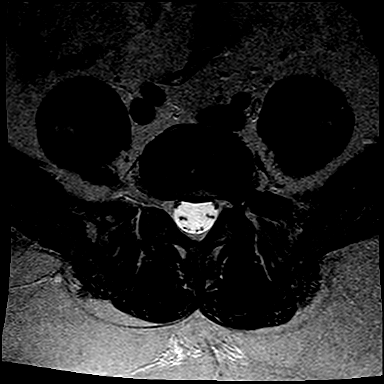
[im 3/23]
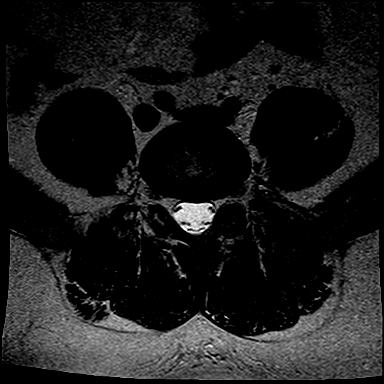
[im 5/23]
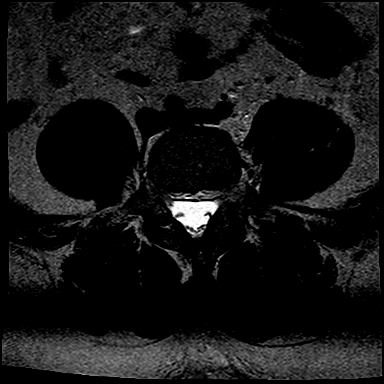
[im 7/23]
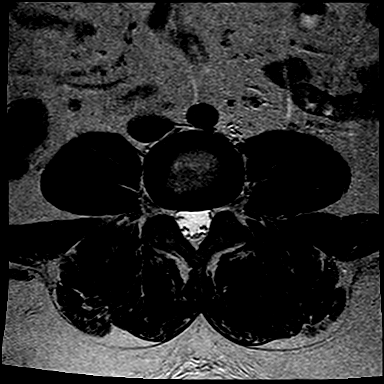
[im 9/23]
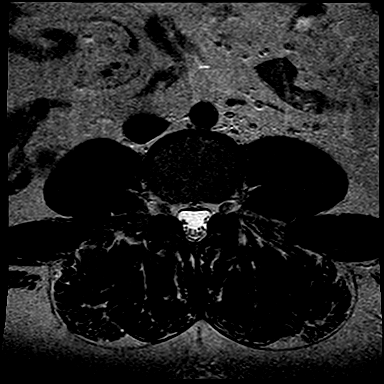
[im 12/23]
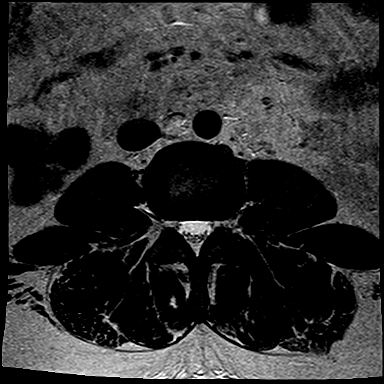
[im 14/23]
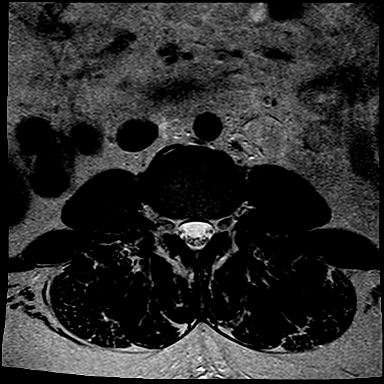
[im 16/23]
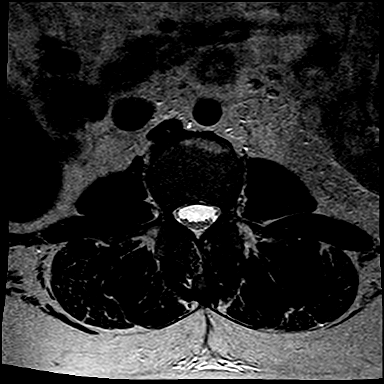
[im 18/23]
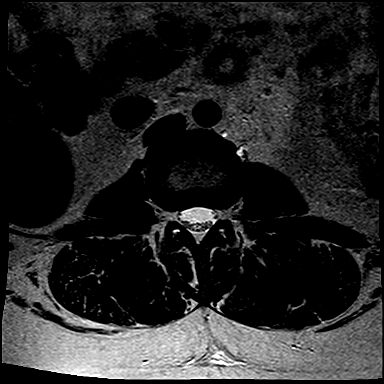
[im 20/23]
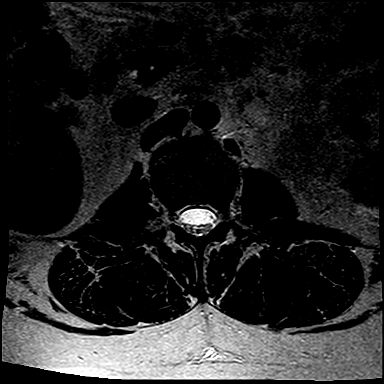
[im 23/23]
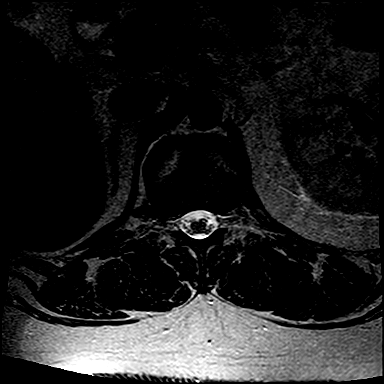

[Series 10: T1 · axial · 4.0mm · 0.52mm/px · z∈[-115,-96]mm · 2 of 23 slices shown (2 of 2)]
[im 1/23]
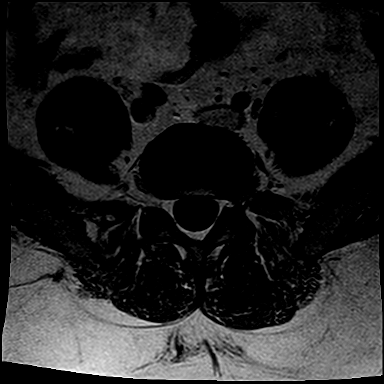
[im 5/23]
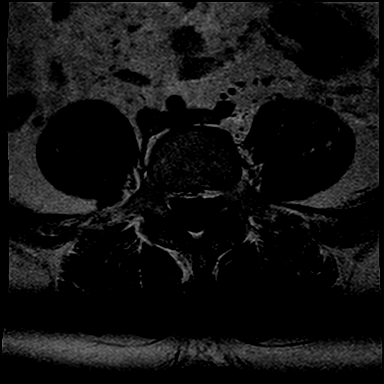

[33 of 48 positions shown; findings below may reference images not displayed]

FINDINGS: The right kidney is not visualized, and there is no pelvic kidney.  This is consistent with unilateral right renal agenesis.  

Examination of the spine reveals mild degenerative changes at multiple levels.  There is no fracture, malalignment, disc herniation, or spinal stenosis.  The conus appears unremarkable.
IMPRESSION: 1. Unilateral right renal agenesis. 

2. Mild degenerative changes.  

3. No fracture, disc herniation, or spinal stenosis is seen.
